# Patient Record
Sex: Female | Born: 1990 | Race: Black or African American | Hispanic: No | Marital: Single | Smoking: Never smoker
Health system: Southern US, Community
[De-identification: ages and names within clinical notes are randomized; demographics above are authoritative.]

## PROBLEM LIST (undated history)

## (undated) DIAGNOSIS — J020 Streptococcal pharyngitis: Secondary | ICD-10-CM

---
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
## 2012-04-06 IMAGING — CT CT ORBITS W/ CM
3 of 4 series · 18 of 30 positions shown, 20 images · IV contrast (agent unspecified)
Comparison: None.

CLINICAL DATA: Left orbital swelling, pain and blurred vision.

CT ORBITS WITH CONTRAST
TECHNIQUE: Multidetector CT imaging of the orbits was performed
following the bolus administration of intravenous contrast.
Contrast: 75 ml Amnipaque-HCC IV

[Series 3: recon 2: supine facial bones · axial · 0.33mm/px · z∈[+89,+109]mm · 2 of 34 slices shown]
[im 9/34  bone]
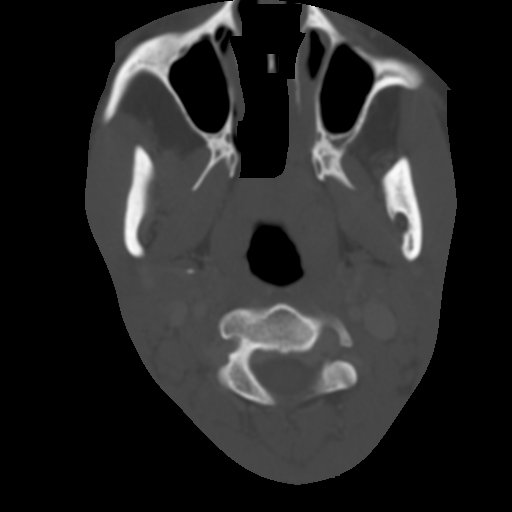
[im 17/34  bone]
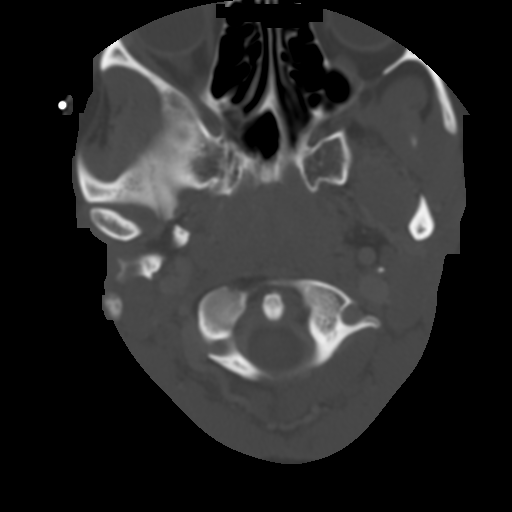

[Series 104: coronal bone · coronal · 0.33mm/px · 8 of 73 slices shown, 10 images]
[im 9/73  brain]
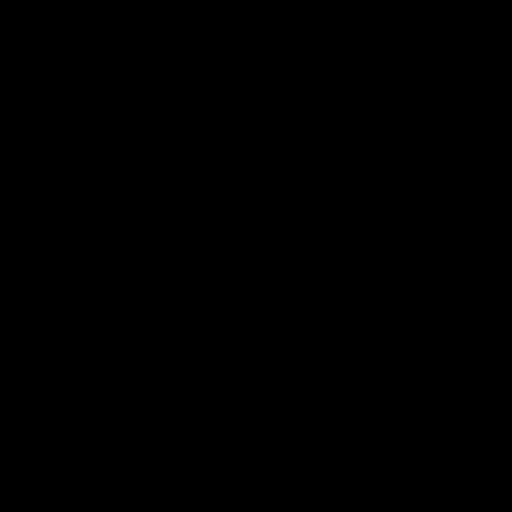
[im 9/73  bone]
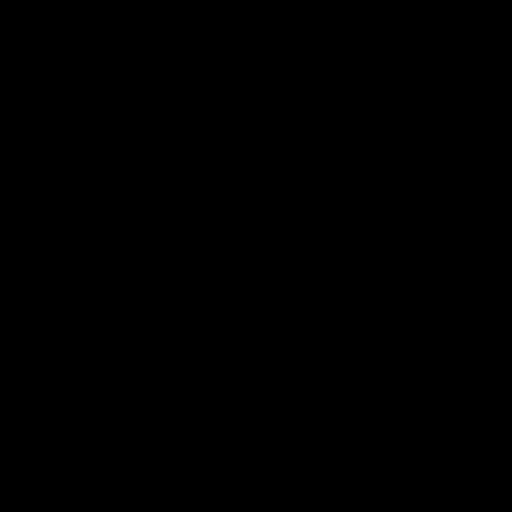
[im 17/73  bone]
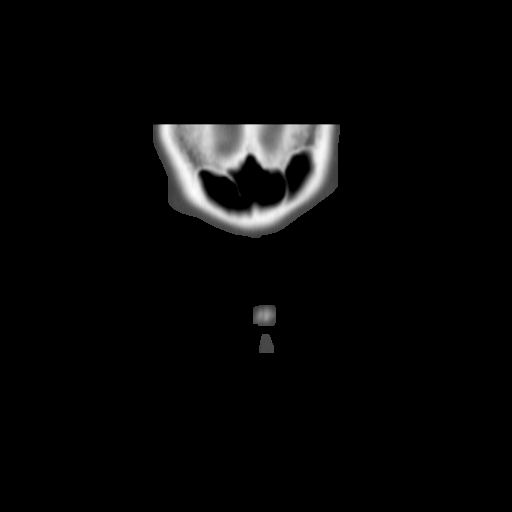
[im 25/73  bone]
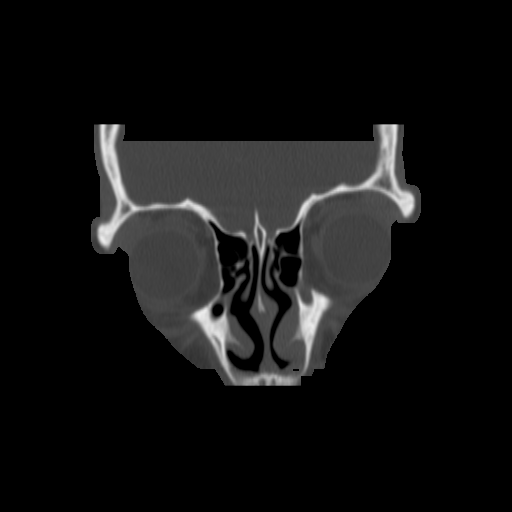
[im 33/73  bone]
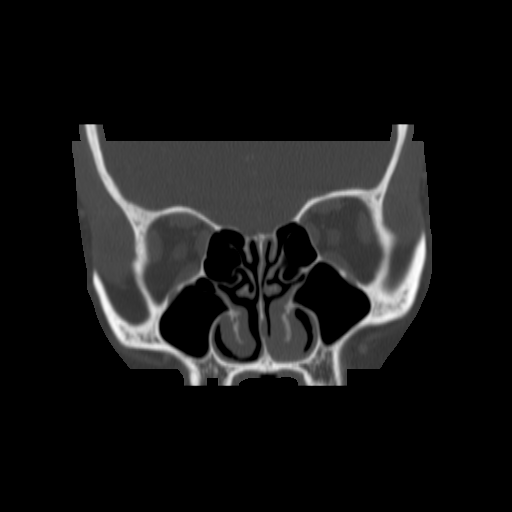
[im 41/73  brain]
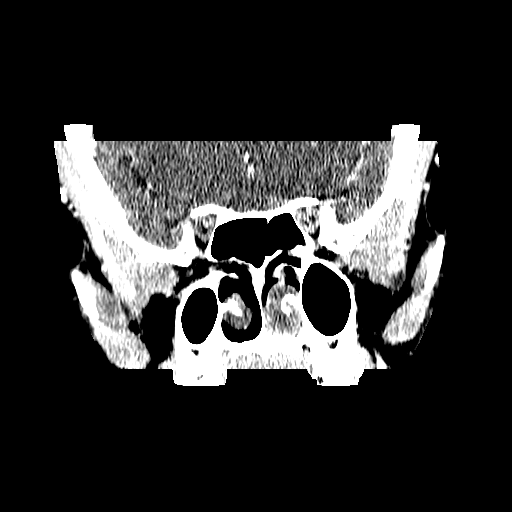
[im 41/73  bone]
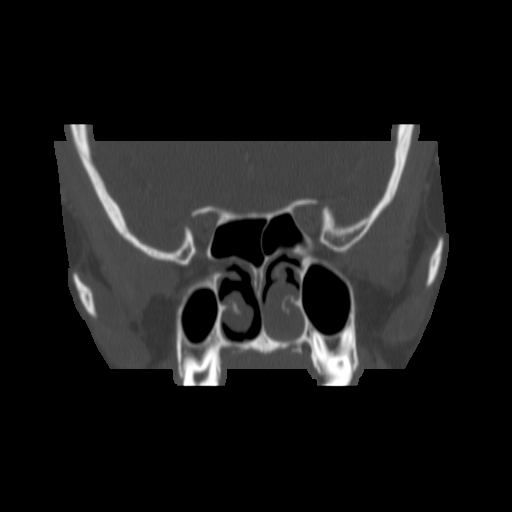
[im 49/73  bone]
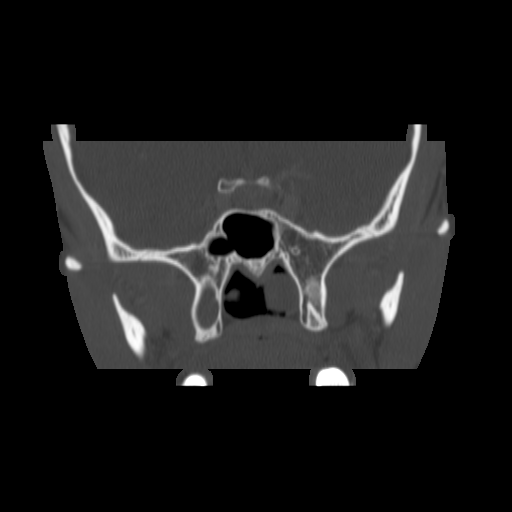
[im 57/73  bone]
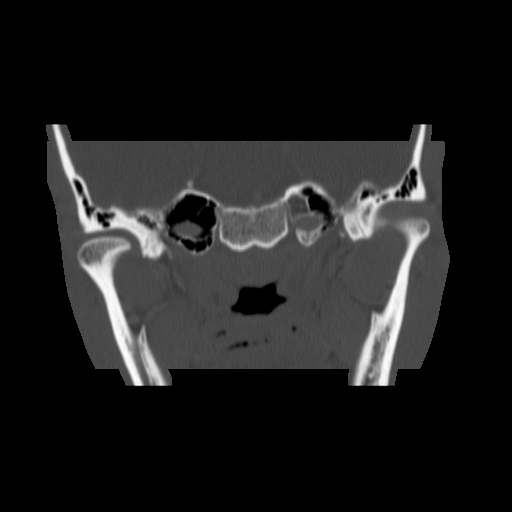
[im 65/73  bone]
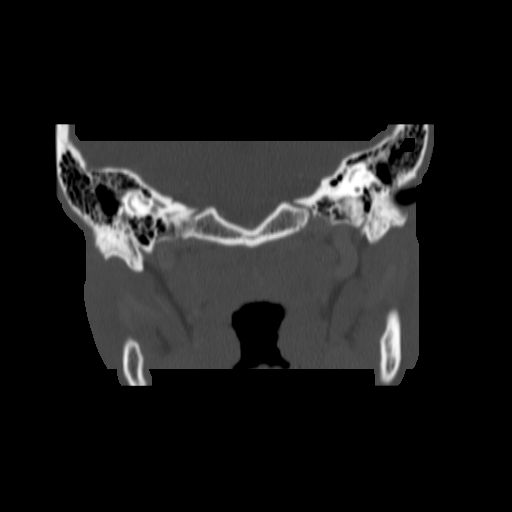

[Series 105: sag st · sagittal · 0.33mm/px · 8 of 76 slices shown]
[im 9/76  bone]
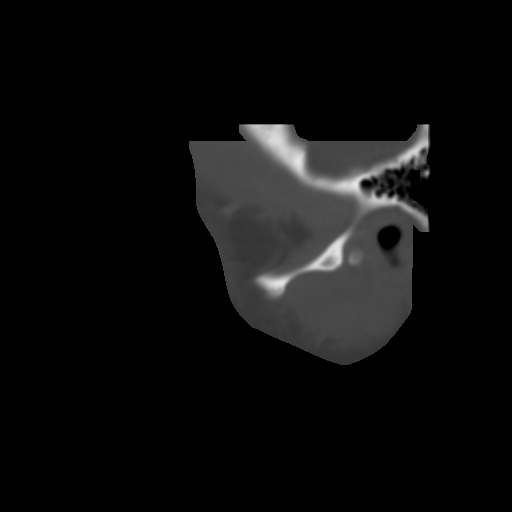
[im 17/76  bone]
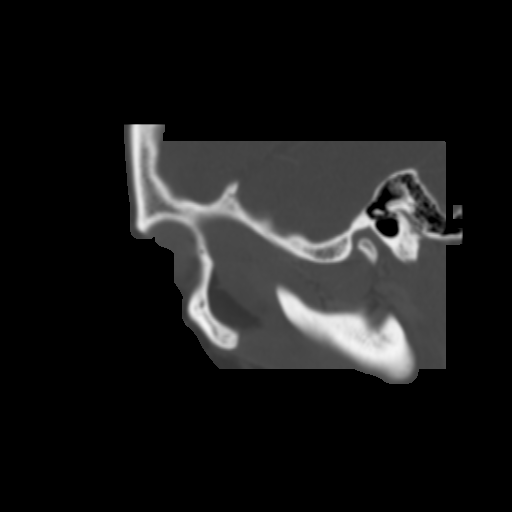
[im 26/76  bone]
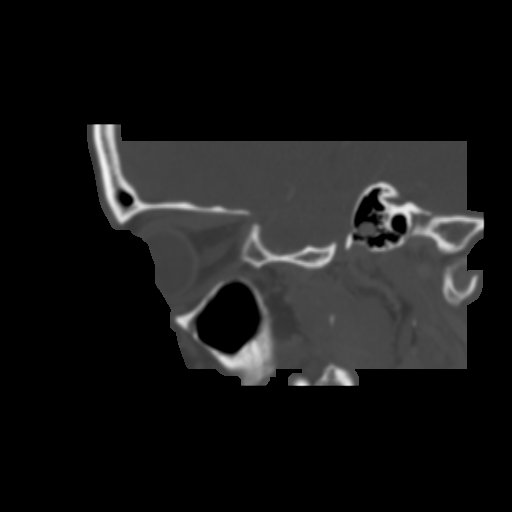
[im 34/76  bone]
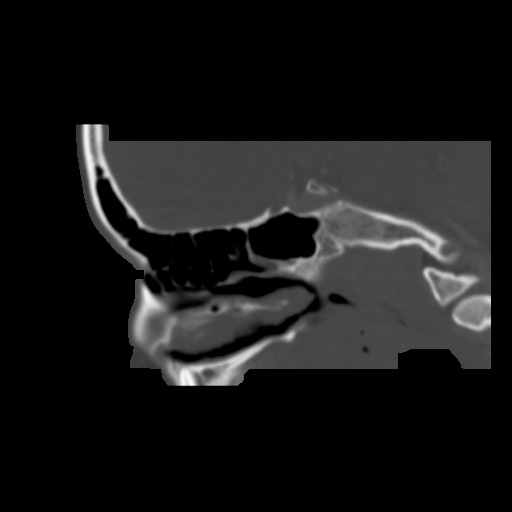
[im 42/76  bone]
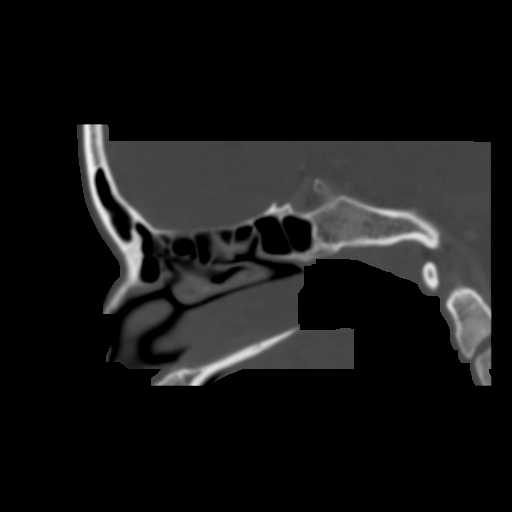
[im 51/76  bone]
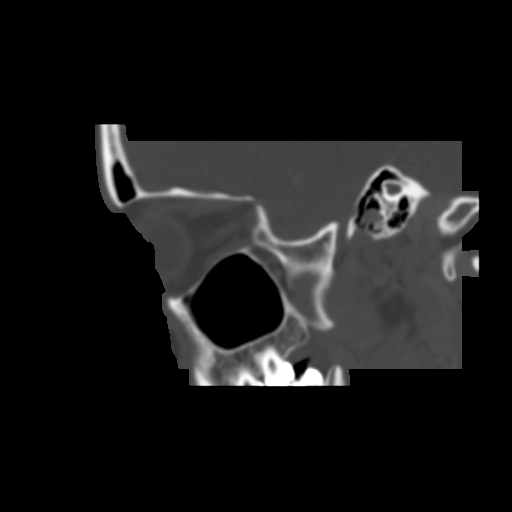
[im 59/76  bone]
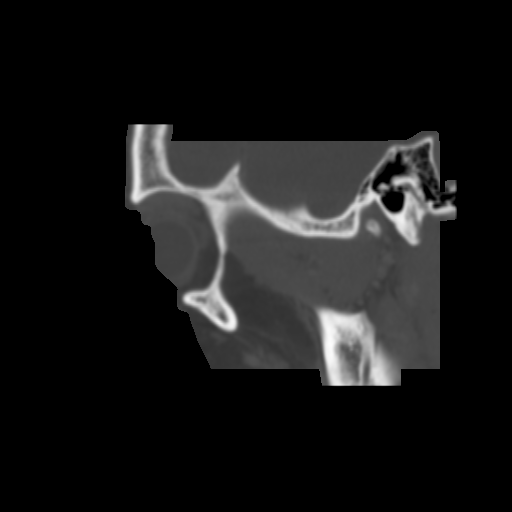
[im 67/76  bone]
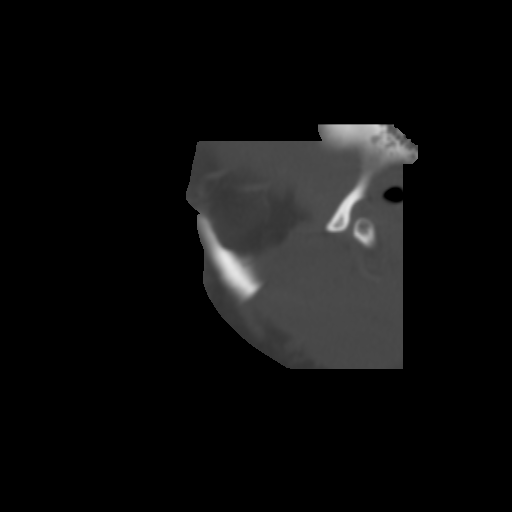

[18 of 30 positions shown; findings below may reference images not displayed]

FINDINGS: There is no evidence of significant orbital cellulitis by
CT.  No intraorbital fluid or inflammatory changes identified.  The
globes are intact and symmetric.  Extraocular musculature appears
normal.

Paranasal sinuses are normally aerated.  No evidence of focal
abscess.
IMPRESSION: No significant orbital cellulitis or evidence of abscess by CT.

## 2012-04-29 ENCOUNTER — Emergency Department (HOSPITAL_COMMUNITY)
Admission: EM | Admit: 2012-04-29 | Discharge: 2012-04-29 | Disposition: A | Discharge status: Home / Self Care | Payer: Medicaid Other | Attending: Emergency Medicine | Admitting: Emergency Medicine

## 2012-04-29 ENCOUNTER — Encounter (HOSPITAL_COMMUNITY): Payer: Self-pay | Admitting: *Deleted

## 2012-04-29 DIAGNOSIS — R51 Headache: Secondary | ICD-10-CM | POA: Insufficient documentation

## 2012-04-29 DIAGNOSIS — Z043 Encounter for examination and observation following other accident: Secondary | ICD-10-CM | POA: Insufficient documentation

## 2012-04-29 DIAGNOSIS — Y9389 Activity, other specified: Secondary | ICD-10-CM | POA: Insufficient documentation

## 2012-04-29 MED ORDER — IBUPROFEN 400 MG PO TABS
400.0000 mg | ORAL_TABLET | Freq: Once | ORAL | Status: AC
  Administered 2012-04-29: 400 mg via ORAL
  Filled 2012-04-29: qty 1
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
## 2012-05-01 NOTE — ED Provider Notes (Signed)
Medical screening examination/treatment/procedure(s) were performed by non-physician practitioner and as supervising physician I was immediately available for consultation/collaboration.   Elisabel Hanover Y. Sarp Vernier, MD 05/01/12 1150 

## 2012-11-25 ENCOUNTER — Emergency Department (HOSPITAL_COMMUNITY)
Admission: EM | Admit: 2012-11-25 | Discharge: 2012-11-25 | Disposition: A | Discharge status: Home / Self Care | Payer: Medicaid Other | Attending: Emergency Medicine | Admitting: Emergency Medicine

## 2012-11-25 ENCOUNTER — Encounter (HOSPITAL_COMMUNITY): Payer: Self-pay | Admitting: Emergency Medicine

## 2012-11-25 DIAGNOSIS — J029 Acute pharyngitis, unspecified: Secondary | ICD-10-CM | POA: Insufficient documentation

## 2012-11-25 DIAGNOSIS — J069 Acute upper respiratory infection, unspecified: Secondary | ICD-10-CM | POA: Insufficient documentation

## 2012-11-25 MED ORDER — FLUTICASONE PROPIONATE 50 MCG/ACT NA SUSP: 2.0000 | Freq: Every day | NASAL | Status: DC

## 2012-11-25 NOTE — ED Notes (Signed)
Pt reports that yesterday she started started having a cough and sore throat. Also reports a runny nose.

## 2012-11-25 NOTE — ED Provider Notes (Signed)
CSN: [id number]     Arrival date & time 11/25/12  1029 History   This chart was scribed for non-physician practitioner Irish Elders, FNP, working with Bonnita Levan. Bernette Mayers, MD, by Yevette Edwards, ED Scribe. This patient was seen in room TR07C/TR07C and the patient's care was started at 11:13 AM.  First MD Initiated Contact with Patient 11/25/12 1102     Chief Complaint  Patient presents with  . Cough  . Sore Throat    HPI HPI Comments: Donna Osborne is a 22 y.o. female who presents to the Emergency Department complaining of a cold symptoms which began yesterday. The pt reports that she first experienced a sore throat. The pt has also experienced a non-productive cough and rhinorrhea. She denies any post nasal drip, fever, chills, nausea, emesis, diarrhea, SOB, or wheezing. She also denies a h/o asthma.  Her boyfriend recently had a cold. The pt reports that she also has a suspected sty to her right eye.   History reviewed. No pertinent past medical history. History reviewed. No pertinent past surgical history. History reviewed. No pertinent family history. History  Substance Use Topics  . Smoking status: Never Smoker   . Smokeless tobacco: Not on file  . Alcohol Use: No   OB History   Grav Para Term Preterm Abortions TAB SAB Ect Mult Living   1              Review of Systems  Constitutional: Negative for fever and chills.  HENT: Positive for congestion, rhinorrhea and sore throat. Negative for postnasal drip.   Eyes:       Possible sty to right eye.    Respiratory: Positive for cough. Negative for shortness of breath and wheezing.   Gastrointestinal: Negative for nausea, vomiting and diarrhea.    Allergies  Review of patient's allergies indicates no known allergies.  Home Medications   Current Outpatient Rx  Name  Route  Sig  Dispense  Refill  . ferrous sulfate 325 (65 FE) MG tablet   Oral   Take 325 mg by mouth every other day.         Marland Kitchen OVER THE COUNTER  MEDICATION   Oral   Take 2 capsules by mouth once. Equate brand "cold and sinus"          Triage Vitals: BP 116/80  Pulse 91  Temp(Src) 98.7 F (37.1 C) (Oral)  Resp 18  SpO2 98%  Physical Exam  Nursing note and vitals reviewed. Constitutional: She is oriented to person, place, and time. She appears well-developed and well-nourished. No distress.  HENT:  Head: Normocephalic and atraumatic.  Mouth/Throat: No oropharyngeal exudate.  Positive light reflex bilaterally. Mild clear fluid present to TMs. TMs are not retracted.  Mildly erythematous to oropharynx.  Tonsils not swollen. No exudate present.   Eyes: EOM are normal.  Neck: Neck supple. No tracheal deviation present.  Cardiovascular: Normal rate.   Pulmonary/Chest: Effort normal. No respiratory distress.  Musculoskeletal: Normal range of motion.  Neurological: She is alert and oriented to person, place, and time.  Skin: Skin is warm and dry.  Psychiatric: She has a normal mood and affect. Her behavior is normal.    ED Course  Procedures (including critical care time)  DIAGNOSTIC STUDIES: Oxygen Saturation is 98% on room air, normal by my interpretation.    COORDINATION OF CARE:  11:17 AM-Discussed treatment plan with patient which includes tylenol and motrin as well as increased fluids, and the patient agreed to the  plan. Informed pt she would be provided a prescription for a steroid nasal spray.   Labs Review Labs Reviewed - No data to display Imaging Review No results found.  EKG Interpretation   None       MDM   1. URI (upper respiratory infection)     Viral URI, no fever or chills. No wheezing or productive cough. Mild sore throat from drainage. Flonase for nasal congestion, increase fluids.    I personally performed the services described in this documentation, which was scribed in my presence. The recorded information has been reviewed and is accurate.     Irish Elders, NP 11/25/12 1138

## 2012-11-25 NOTE — ED Provider Notes (Signed)
Medical screening examination/treatment/procedure(s) were performed by non-physician practitioner and as supervising physician I was immediately available for consultation/collaboration.   Charles B. Sheldon, MD 11/25/12 1546 

## 2013-12-07 ENCOUNTER — Encounter (HOSPITAL_COMMUNITY): Payer: Self-pay | Admitting: Emergency Medicine

## 2014-07-26 ENCOUNTER — Emergency Department (HOSPITAL_COMMUNITY)
Admission: EM | Admit: 2014-07-26 | Discharge: 2014-07-26 | Disposition: A | Discharge status: Home / Self Care | Payer: BLUE CROSS/BLUE SHIELD | Attending: Emergency Medicine | Admitting: Emergency Medicine

## 2014-07-26 ENCOUNTER — Encounter (HOSPITAL_COMMUNITY): Payer: Self-pay | Admitting: *Deleted

## 2014-07-26 DIAGNOSIS — J069 Acute upper respiratory infection, unspecified: Secondary | ICD-10-CM | POA: Diagnosis not present

## 2014-07-26 DIAGNOSIS — R490 Dysphonia: Secondary | ICD-10-CM | POA: Diagnosis present

## 2014-07-26 DIAGNOSIS — R590 Localized enlarged lymph nodes: Secondary | ICD-10-CM | POA: Insufficient documentation

## 2014-07-26 DIAGNOSIS — Z7951 Long term (current) use of inhaled steroids: Secondary | ICD-10-CM | POA: Diagnosis not present

## 2014-07-26 LAB — RAPID STREP SCREEN (MED CTR MEBANE ONLY): STREPTOCOCCUS, GROUP A SCREEN (DIRECT): NEGATIVE

## 2014-07-26 MED ORDER — LORATADINE 10 MG PO TABS: 10.0000 mg | ORAL_TABLET | Freq: Every day | ORAL | Status: DC

## 2014-07-26 MED ORDER — GUAIFENESIN 100 MG/5ML PO SYRP: 100.0000 mg | ORAL_SOLUTION | ORAL | Status: DC | PRN

## 2014-07-26 NOTE — Discharge Instructions (Signed)
Your strep screen is negative. We are treating your symptoms with medication for congestion. Follow up with your doctor or return as needed for worsening symptoms.

## 2014-07-26 NOTE — ED Notes (Signed)
Declined W/C at D/C and was escorted to lobby by RN. 

## 2014-07-26 NOTE — ED Notes (Signed)
Pt states that she has been hoarse and had nasal drainage since yesterday.

## 2014-07-26 NOTE — ED Provider Notes (Signed)
CSN: [id number]     Arrival date & time 07/26/14  1358 History   This chart was scribed for non-physician practitioner, [facility] M. Damian Leavell, NP working with Doug Sou, MD by Doreatha Martin, ED scribe. This patient was seen in room TR07C/TR07C and the patient's care was started at 2:31 PM    Chief Complaint  Patient presents with  . Hoarse   Patient is a 24 y.o. female presenting with pharyngitis. The history is provided by the patient. No language interpreter was used.  Sore Throat This is a new problem. The current episode started more than 2 days ago. The problem occurs constantly. The problem has not changed since onset.Pertinent negatives include no abdominal pain and no shortness of breath. Nothing aggravates the symptoms. Nothing relieves the symptoms. She has tried nothing for the symptoms. The treatment provided no relief.   HPI Comments: Donna Osborne is a 24 y.o. female who presents to the Emergency Department complaining of moderate, constant hoarseness onset yesterday. She states associated sneezing and drainage onset 4 days ago, sore throat onset 3 days ago, chills, swollen cervical glands, no difficulty swallowing, dry cough. She states sick contact with daughter who has similar symptoms. She denies pregnancy and breast feeding. She also denies fever, nausea, vomiting, constipation, diarrhea, SOB, eye drainage or  redness, changes in vision, otalgia, abdominal pain and back pain.   History reviewed. No pertinent past medical history. History reviewed. No pertinent past surgical history. No family history on file. History  Substance Use Topics  . Smoking status: Never Smoker   . Smokeless tobacco: Not on file  . Alcohol Use: No   OB History    Gravida Para Term Preterm AB TAB SAB Ectopic Multiple Living   1              Review of Systems  HENT: Positive for postnasal drip, sneezing and voice change. Negative for trouble swallowing.   Eyes: Negative for redness and visual  disturbance.  Respiratory: Positive for cough (dry cough). Negative for shortness of breath.   Gastrointestinal: Negative for nausea, vomiting, abdominal pain, diarrhea and constipation.  Musculoskeletal: Negative for back pain.  All other systems reviewed and are negative.  Allergies  Review of patient's allergies indicates no known allergies.  Home Medications   Prior to Admission medications   Medication Sig Start Date End Date Taking? Authorizing Provider  ferrous sulfate 325 (65 FE) MG tablet Take 325 mg by mouth every other day.    Historical Provider, MD  fluticasone (FLONASE) 50 MCG/ACT nasal spray Place 2 sprays into the nose daily. 11/25/12   Irish Elders, NP  guaifenesin (ROBITUSSIN) 100 MG/5ML syrup Take 5-10 mLs (100-200 mg total) by mouth every 4 (four) hours as needed for cough. 07/26/14   Hope Orlene Och, NP  loratadine (CLARITIN) 10 MG tablet Take 1 tablet (10 mg total) by mouth daily. 07/26/14   Hope Orlene Och, NP  OVER THE COUNTER MEDICATION Take 2 capsules by mouth once. Equate brand "cold and sinus"    Historical Provider, MD   BP 111/63 mmHg  Pulse 61  Temp(Src) 98.4 F (36.9 C) (Oral)  Resp 16  SpO2 96% Physical Exam  Constitutional: She is oriented to person, place, and time. She appears well-developed and well-nourished. No distress.  HENT:  Head: Normocephalic and atraumatic.  Right Ear: Tympanic membrane normal.  Left Ear: Tympanic membrane normal.  Nose: Mucosal edema and rhinorrhea present.  Mouth/Throat: Uvula is midline and mucous membranes are normal.  Posterior oropharyngeal erythema present. No posterior oropharyngeal edema.  Eyes: Conjunctivae and EOM are normal. Pupils are equal, round, and reactive to light.  Neck: Normal range of motion. Neck supple.  No meningeal signs. Cervical node enlarged on the right.   Cardiovascular: Normal rate, regular rhythm and normal heart sounds.   Pulmonary/Chest: Effort normal and breath sounds normal. No respiratory  distress.  Lungs CTA.  Abdominal: Soft. Bowel sounds are normal. She exhibits no distension.  Genitourinary:  No CVA tenderness.   Musculoskeletal: Normal range of motion.  Lymphadenopathy:    She has cervical adenopathy (right).  Neurological: She is alert and oriented to person, place, and time.  Skin: Skin is warm and dry.  Psychiatric: She has a normal mood and affect. Her behavior is normal.  Nursing note and vitals reviewed.   ED Course  Procedures (including critical care time) DIAGNOSTIC STUDIES: Oxygen Saturation is 96% on RA, adequate by my interpretation.    COORDINATION OF CARE: 2:37 PM Discussed treatment plan with pt at bedside and pt agreed to plan.   Labs Review Results for orders placed or performed during the hospital encounter of 07/26/14 (from the past 24 hour(s))  Rapid strep screen     Status: None   Collection Time: 07/26/14  2:40 PM  Result Value Ref Range   Streptococcus, Group A Screen (Direct) NEGATIVE NEGATIVE     MDM  24 y.o. female with sore throat, dry cough, post nasal drainage x 4 days. Stable for d/c without difficulty swallowing and patient does not appear toxic. Will treat for URI and she will follow up with her PCP or return her as needed.   Final diagnoses:  Acute URI    I personally performed the services described in this documentation, which was scribed in my presence. The recorded information has been reviewed and is accurate.    [address] [place], [place] 07/26/14 2143  Doug Sou, MD 07/27/14 [phone]

## 2014-07-28 LAB — CULTURE, GROUP A STREP: Strep A Culture: NEGATIVE

## 2015-01-28 ENCOUNTER — Encounter (HOSPITAL_COMMUNITY): Payer: Self-pay

## 2015-01-28 ENCOUNTER — Emergency Department (HOSPITAL_COMMUNITY)
Admission: EM | Admit: 2015-01-28 | Discharge: 2015-01-28 | Disposition: A | Discharge status: Home / Self Care | Payer: BLUE CROSS/BLUE SHIELD | Attending: Emergency Medicine | Admitting: Emergency Medicine

## 2015-01-28 DIAGNOSIS — J029 Acute pharyngitis, unspecified: Secondary | ICD-10-CM | POA: Insufficient documentation

## 2015-01-28 DIAGNOSIS — Z7951 Long term (current) use of inhaled steroids: Secondary | ICD-10-CM | POA: Insufficient documentation

## 2015-01-28 DIAGNOSIS — R509 Fever, unspecified: Secondary | ICD-10-CM | POA: Insufficient documentation

## 2015-01-28 DIAGNOSIS — Z79899 Other long term (current) drug therapy: Secondary | ICD-10-CM | POA: Diagnosis not present

## 2015-01-28 LAB — RAPID STREP SCREEN (MED CTR MEBANE ONLY): STREPTOCOCCUS, GROUP A SCREEN (DIRECT): NEGATIVE

## 2015-01-28 MED ORDER — ACETAMINOPHEN 325 MG PO TABS
650.0000 mg | ORAL_TABLET | Freq: Once | ORAL | Status: AC | PRN
  Administered 2015-01-28: 650 mg via ORAL
  Filled 2015-01-28: qty 2

## 2015-01-28 MED ORDER — PENICILLIN G BENZATHINE 1200000 UNIT/2ML IM SUSP
1.2000 10*6.[IU] | Freq: Once | INTRAMUSCULAR | Status: AC
  Administered 2015-01-28: 1.2 10*6.[IU] via INTRAMUSCULAR
  Filled 2015-01-28: qty 2

## 2015-01-28 NOTE — Discharge Instructions (Signed)
You have been treated for strep throat. May use over the counter chloraseptic to help with throat pain. May take tylenol/motrin as needed for pain or fever. Follow-up with your primary care physician. Return here for new concerns.

## 2015-01-28 NOTE — ED Notes (Signed)
Pt verbalized understanding of d/c instructions and follow-up care. No further questions/concerns, VSS, ambulatory w/ steady gait (refused wheelchair) 

## 2015-01-28 NOTE — ED Notes (Signed)
Pt c/o sore throat, onset Tuesday. Denies any respiratory congestion or difficulty breathing.

## 2015-01-28 NOTE — ED Provider Notes (Signed)
CSN: [id number]     Arrival date & time 01/28/15  7253 History   First MD Initiated Contact with Patient 01/28/15 [phone]     Chief Complaint  Patient presents with  . Sore Throat     (Consider location/radiation/quality/duration/timing/severity/associated sxs/prior Treatment) Patient is a 24 y.o. female presenting with pharyngitis. The history is provided by the patient and medical records.  Sore Throat Associated symptoms include a sore throat.   24 year old female here with sore throat. Patient states over the past 3 days she has had a progressively worsening sore throat. She denies any fever or chills at home, low-grade fever here in ED. States it is becoming painful to swallow, however she is still able to eat and drink normally. She denies any sick contacts. She has history of strep throat a few years ago with similar symptoms. She is not tried any medications prior to arrival.  No past medical history on file. Past Surgical History  Procedure Laterality Date  . Wisdom tooth extraction  2011   No family history on file. Social History  Substance Use Topics  . Smoking status: Never Smoker   . Smokeless tobacco: Not on file  . Alcohol Use: No   OB History    Gravida Para Term Preterm AB TAB SAB Ectopic Multiple Living   1              Review of Systems  HENT: Positive for sore throat.   All other systems reviewed and are negative.     Allergies  Review of patient's allergies indicates no known allergies.  Home Medications   Prior to Admission medications   Medication Sig Start Date End Date Taking? Authorizing Provider  ferrous sulfate 325 (65 FE) MG tablet Take 325 mg by mouth every other day.    Historical Provider, MD  fluticasone (FLONASE) 50 MCG/ACT nasal spray Place 2 sprays into the nose daily. 11/25/12   Irish Elders, NP  guaifenesin (ROBITUSSIN) 100 MG/5ML syrup Take 5-10 mLs (100-200 mg total) by mouth every 4 (four) hours as needed for cough. 07/26/14    Hope Orlene Och, NP  loratadine (CLARITIN) 10 MG tablet Take 1 tablet (10 mg total) by mouth daily. 07/26/14   Hope Orlene Och, NP  OVER THE COUNTER MEDICATION Take 2 capsules by mouth once. Equate brand "cold and sinus"    Historical Provider, MD   BP 112/71 mmHg  Pulse 108  Temp(Src) 99.9 F (37.7 C) (Oral)  Resp 18  Ht  (1.626 m)  Wt 73.483 kg  BMI 27.79 kg/m2  SpO2 98%  LMP 01/24/2015   Physical Exam  Constitutional: She is oriented to person, place, and time. She appears well-developed and well-nourished. No distress.  HENT:  Head: Normocephalic and atraumatic.  Mouth/Throat: Uvula is midline and mucous membranes are normal. No oral lesions. No trismus in the jaw. Posterior oropharyngeal erythema present.  Tonsils erythematous and enlarged bilaterally with small exudates present; uvula midline without peritonsillar abscess; handling secretions appropriately; no difficulty swallowing or speaking  Eyes: Conjunctivae and EOM are normal. Pupils are equal, round, and reactive to light.  Neck: Normal range of motion. Neck supple.  Cardiovascular: Normal rate, regular rhythm and normal heart sounds.   Pulmonary/Chest: Effort normal and breath sounds normal. No respiratory distress. She has no wheezes.  Abdominal: Soft. Bowel sounds are normal. There is no tenderness. There is no guarding.  Musculoskeletal: Normal range of motion.  Lymphadenopathy:    She has cervical adenopathy.  Neurological:  She is alert and oriented to person, place, and time.  Skin: Skin is warm. She is not diaphoretic.  Psychiatric: She has a normal mood and affect.  Nursing note and vitals reviewed.   ED Course  Procedures (including critical care time) Labs Review Labs Reviewed  RAPID STREP SCREEN (NOT AT [facility])  CULTURE, GROUP A STREP    Imaging Review No results found. I have personally reviewed and evaluated these images and lab results as part of my medical decision-making.   EKG  Interpretation None      MDM   Final diagnoses:  Sore throat   24 year old female here with sore throat. Exam findings are clinically consistent with strep pharyngitis. She has no evidence of peritonsillar abscess at this time. She is handling her secretions well and has no difficulty swallowing or speaking. Her rapid strep is negative, however i feel that her exam findings warrant treatment.  Strep culture pending.  Patient treated with bicillin here, d/c home with supportive care.  Discussed plan with patient, he/she acknowledged understanding and agreed with plan of care.  Return precautions given for new or worsening symptoms.  Garlon HatchetLisa M Caelan Atchley, PA-C 01/28/15 1318  Rolan BuccoMelanie Belfi, MD 01/28/15 1430

## 2015-01-30 LAB — CULTURE, GROUP A STREP: Strep A Culture: NEGATIVE

## 2015-05-09 ENCOUNTER — Encounter (HOSPITAL_COMMUNITY): Payer: Self-pay | Admitting: Nurse Practitioner

## 2015-05-09 ENCOUNTER — Emergency Department (HOSPITAL_COMMUNITY)
Admission: EM | Admit: 2015-05-09 | Discharge: 2015-05-09 | Disposition: A | Discharge status: Home / Self Care | Payer: BLUE CROSS/BLUE SHIELD | Attending: Emergency Medicine | Admitting: Emergency Medicine

## 2015-05-09 DIAGNOSIS — Y92410 Unspecified street and highway as the place of occurrence of the external cause: Secondary | ICD-10-CM | POA: Insufficient documentation

## 2015-05-09 DIAGNOSIS — S0990XA Unspecified injury of head, initial encounter: Secondary | ICD-10-CM | POA: Diagnosis not present

## 2015-05-09 DIAGNOSIS — Y998 Other external cause status: Secondary | ICD-10-CM | POA: Diagnosis not present

## 2015-05-09 DIAGNOSIS — S199XXA Unspecified injury of neck, initial encounter: Secondary | ICD-10-CM | POA: Diagnosis not present

## 2015-05-09 DIAGNOSIS — Y9389 Activity, other specified: Secondary | ICD-10-CM | POA: Insufficient documentation

## 2015-05-09 DIAGNOSIS — Z79899 Other long term (current) drug therapy: Secondary | ICD-10-CM | POA: Insufficient documentation

## 2015-05-09 DIAGNOSIS — S6992XA Unspecified injury of left wrist, hand and finger(s), initial encounter: Secondary | ICD-10-CM | POA: Insufficient documentation

## 2015-05-09 MED ORDER — ACETAMINOPHEN 325 MG PO TABS
650.0000 mg | ORAL_TABLET | Freq: Once | ORAL | Status: AC
  Administered 2015-05-09: 650 mg via ORAL
  Filled 2015-05-09: qty 2

## 2015-05-09 MED ORDER — METHOCARBAMOL 500 MG PO TABS: 500.0000 mg | ORAL_TABLET | Freq: Two times a day (BID) | ORAL | Status: DC

## 2015-05-09 MED ORDER — NAPROXEN 500 MG PO TABS: 500.0000 mg | ORAL_TABLET | Freq: Two times a day (BID) | ORAL | Status: DC
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
## 2015-05-09 NOTE — Discharge Instructions (Signed)
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
## 2015-05-09 NOTE — ED Notes (Signed)
Declined W/C at D/C and was escorted to lobby by RN. 

## 2016-06-30 ENCOUNTER — Encounter (HOSPITAL_COMMUNITY): Payer: Self-pay | Admitting: *Deleted

## 2016-06-30 ENCOUNTER — Emergency Department (HOSPITAL_COMMUNITY)
Admission: EM | Admit: 2016-06-30 | Discharge: 2016-06-30 | Disposition: A | Discharge status: Home / Self Care | Payer: BLUE CROSS/BLUE SHIELD | Attending: Physician Assistant | Admitting: Physician Assistant

## 2016-06-30 DIAGNOSIS — J029 Acute pharyngitis, unspecified: Secondary | ICD-10-CM

## 2016-06-30 DIAGNOSIS — Z79899 Other long term (current) drug therapy: Secondary | ICD-10-CM | POA: Insufficient documentation

## 2016-06-30 HISTORY — DX: Streptococcal pharyngitis: J02.0

## 2016-06-30 LAB — RAPID STREP SCREEN (MED CTR MEBANE ONLY): Streptococcus, Group A Screen (Direct): NEGATIVE

## 2016-06-30 MED ORDER — PROMETHAZINE-DM 6.25-15 MG/5ML PO SYRP
5.0000 mL | ORAL_SOLUTION | Freq: Four times a day (QID) | ORAL | 0 refills | Status: DC | PRN
Start: 2016-06-30 — End: 2017-04-11

## 2016-06-30 NOTE — Discharge Instructions (Signed)
Viral pharyngitis is treated by managing the symptoms. You can take ibuprofen, 400 mg every 4 hours to help with relief from sore throat. You can also take the cough syrup every 4-6 hours (5 mL) as needed for cough suppression. Please note: this medication can make you sleepy so please use caution with driving or working. If you develop a fever, chills, or difficulty breathing, please return to the Emergency Department for re-evaluation.

## 2016-06-30 NOTE — ED Triage Notes (Signed)
To ED for eval of sore throat for past couple of days. Unknown fevers. No resp distress noted.

## 2016-06-30 NOTE — ED Provider Notes (Signed)
[facility] Provider Note   CSN: [id number] Arrival date & time: 06/30/16  1130  By signing my name below, I, Diona Browner, attest that this documentation has been prepared under the direction and in the presence of Maddilynn Esperanza A. Adisson Deak, PA-C. Electronically Signed: Diona Browner, ED Scribe. 06/30/16. 12:16 PM.  History   Chief Complaint Chief Complaint  Patient presents with  . Sore Throat    HPI Donna Osborne is a 26 y.o. female with a PMHx of strep throat who presents to the Emergency Department complaining of a gradually worsening sore throat that started 4 days ago. Pt reports waking up with the discomfort. She notes trouble swallowing and feels like her glands are swollen. She hasn't taken any medication at home for the pain. Associated sx include HA, congestion, and cough. No allergies to medication. No medications taken daily. No one else is sick at home. Pt denies fever, SOB, drooling, rhinorrhea, nasal drainage and ear pain.  The history is provided by the patient. No language interpreter was used.    Past Medical History:  Diagnosis Date  . Strep throat     There are no active problems to display for this patient.   Past Surgical History:  Procedure Laterality Date  . WISDOM TOOTH EXTRACTION  2011    OB History    Gravida Para Term Preterm AB Living   1             SAB TAB Ectopic Multiple Live Births                   Home Medications    Prior to Admission medications   Medication Sig Start Date End Date Taking? Authorizing Provider  ferrous sulfate 325 (65 FE) MG tablet Take 325 mg by mouth every other day.    [provider]  methocarbamol (ROBAXIN) 500 MG tablet Take 1 tablet (500 mg total) by mouth 2 (two) times daily. 05/09/15   Felicie Morn, NP  naproxen (NAPROSYN) 500 MG tablet Take 1 tablet (500 mg total) by mouth 2 (two) times daily. 05/09/15   Felicie Morn, NP  promethazine-dextromethorphan (PROMETHAZINE-DM) 6.25-15 MG/5ML  syrup Take 5 mLs by mouth 4 (four) times daily as needed for cough. 06/30/16   Samiyah Stupka A, PA-C    Family History No family history on file.  Social History Social History  Substance Use Topics  . Smoking status: Never Smoker  . Smokeless tobacco: Never Used  . Alcohol use No     Allergies   Patient has no known allergies.   Review of Systems Review of Systems  Constitutional: Negative for activity change and fever.  HENT: Positive for congestion, sore throat and trouble swallowing. Negative for drooling, ear pain, postnasal drip and rhinorrhea.   Respiratory: Positive for cough. Negative for shortness of breath.   Cardiovascular: Negative for chest pain.  Gastrointestinal: Negative for abdominal pain.  Musculoskeletal: Negative for back pain.  Skin: Negative for rash.  Neurological: Positive for headaches.    Physical Exam Updated Vital Signs BP 99/66   Pulse 86   Temp 98.8 F (37.1 C) (Oral)   Resp 16   SpO2 100%   Physical Exam  Constitutional: No distress.  HENT:  Head: Normocephalic.  Right Ear: Tympanic membrane normal.  Left Ear: Tympanic membrane normal.  Nose: Nose normal.  Mouth/Throat: Uvula is midline. Posterior oropharyngeal erythema present. No oropharyngeal exudate, posterior oropharyngeal edema or tonsillar abscesses.  Eyes: Conjunctivae are normal.  Neck: Neck supple.  Cardiovascular: Normal rate, regular rhythm and normal heart sounds.  Exam reveals no gallop and no friction rub.   No murmur heard. Pulmonary/Chest: Effort normal and breath sounds normal. No respiratory distress. She has no wheezes. She has no rales.  Abdominal: Soft. She exhibits no distension.  Lymphadenopathy:    She has cervical adenopathy.  Neurological: She is alert.  Skin: Skin is warm. No rash noted.  Psychiatric: Her behavior is normal.  Nursing note and vitals reviewed.   ED Treatments / Results  DIAGNOSTIC STUDIES: Oxygen Saturation is 100% on RA, normal  by my interpretation.   COORDINATION OF CARE: 12:16 PM-Discussed next steps with pt which includes. Pt verbalized understanding and is agreeable with the plan.    Labs (all labs ordered are listed, but only abnormal results are displayed) Labs Reviewed  RAPID STREP SCREEN (NOT AT [facility])  CULTURE, GROUP A STREP [facility])    EKG  EKG Interpretation None       Radiology No results found.  Procedures Procedures (including critical care time)  Medications Ordered in ED Medications - No data to display   Initial Impression / Assessment and Plan / ED Course  I have reviewed the triage vital signs and the nursing notes.  Pertinent labs & imaging results that were available during my care of the patient were reviewed by me and considered in my medical decision making (see chart for details).     26 y.o. female patient with 4 days of acute pharyngitis.   Afebrile with anterior cervical lymphadenopathy; no cough. No tonsillar exudate or swelling. Pain is not out of proportion to exam. No asymmetry of the uvula, tonsils, or soft palate. No stridor, trismus, or "hot potato" voice. The patient appears non-toxic. NAD. VSS. Rapid strep negative.  Presentation non concerning for PTA or Ludwig's angina, Uvulitis, epiglottitis, peritonsillar abscess, or retropharyngeal abscess. Specific return precautions discussed. Pt able to drink water in ED without difficulty with intact air way. Will d/c the patient with symptomatic treatment. Recommended PCP follow up.      Final Clinical Impressions(s) / ED Diagnoses   Final diagnoses:  Viral pharyngitis    New Prescriptions Discharge Medication List as of 06/30/2016 12:51 PM    START taking these medications   Details  promethazine-dextromethorphan (PROMETHAZINE-DM) 6.25-15 MG/5ML syrup Take 5 mLs by mouth 4 (four) times daily as needed for cough., Starting Sat 06/30/2016, Print       I personally performed the services described in this  documentation, which was scribed in my presence. The recorded information has been reviewed and is accurate.     Barkley BoardsMcDonald, Keylen Uzelac A, PA-C 07/03/16 1229    Abelino DerrickMackuen, Courteney Lyn, MD 07/05/16 [phone]

## 2016-06-30 NOTE — ED Notes (Signed)
Declined W/C at D/C and was escorted to lobby by RN. 

## 2016-07-02 LAB — CULTURE, GROUP A STREP (THRC)

## 2016-07-12 ENCOUNTER — Encounter (HOSPITAL_COMMUNITY): Payer: Self-pay | Admitting: Emergency Medicine

## 2016-07-12 ENCOUNTER — Ambulatory Visit (HOSPITAL_COMMUNITY)
Admission: EM | Admit: 2016-07-12 | Discharge: 2016-07-12 | Disposition: A | Discharge status: Home / Self Care | Payer: Self-pay | Attending: Internal Medicine | Admitting: Internal Medicine

## 2016-07-12 DIAGNOSIS — T671XXA Heat syncope, initial encounter: Secondary | ICD-10-CM

## 2016-07-12 DIAGNOSIS — S00212A Abrasion of left eyelid and periocular area, initial encounter: Secondary | ICD-10-CM

## 2016-07-12 DIAGNOSIS — E860 Dehydration: Secondary | ICD-10-CM

## 2016-07-12 NOTE — ED Provider Notes (Signed)
CSN: [id number]     Arrival date & time 07/12/16  1902 History   First MD Initiated Contact with Patient 07/12/16 2043     Chief Complaint  Patient presents with  . Laceration  . Loss of Consciousness   (Consider location/radiation/quality/duration/timing/severity/associated sxs/prior Treatment) 26 year old female presents to the urgent care stating around 10 AM this morning she got up suddenly gone to the bathroom at a nail salon and she got weak and had a brief loss of consciousness. She fell and struck her right eyelid on an object causing a superficial abrasion. She states this is happened in the past due to dehydration. Patient admits that she does not drink enough water that she has been out in the sun working and not rehydrating. She states she feels well now, no headache no problems with vision, speech, hearing, swallowing, focal paresthesias or weakness. Denies problems with memory, cognition. She wants her wound of the left eyelid check to see if it needs repaired.      Past Medical History:  Diagnosis Date  . Strep throat    Past Surgical History:  Procedure Laterality Date  . WISDOM TOOTH EXTRACTION  2011   History reviewed. No pertinent family history. Social History  Substance Use Topics  . Smoking status: Never Smoker  . Smokeless tobacco: Never Used  . Alcohol use No   OB History    Gravida Para Term Preterm AB Living   1             SAB TAB Ectopic Multiple Live Births                 Review of Systems  Constitutional: Negative.   HENT: Negative.   Eyes: Negative for pain, discharge, redness and visual disturbance.  Respiratory: Negative.   Gastrointestinal: Negative.   Musculoskeletal: Negative.   Skin: Positive for wound.  Neurological: Negative.   Psychiatric/Behavioral: Negative.   All other systems reviewed and are negative.   Allergies  Patient has no known allergies.  Home Medications   Prior to Admission medications   Medication Sig  Start Date End Date Taking? Authorizing Provider  ferrous sulfate 325 (65 FE) MG tablet Take 325 mg by mouth every other day.    [provider]  methocarbamol (ROBAXIN) 500 MG tablet Take 1 tablet (500 mg total) by mouth 2 (two) times daily. 05/09/15   Felicie Morn, NP  naproxen (NAPROSYN) 500 MG tablet Take 1 tablet (500 mg total) by mouth 2 (two) times daily. 05/09/15   Felicie Morn, NP  promethazine-dextromethorphan (PROMETHAZINE-DM) 6.25-15 MG/5ML syrup Take 5 mLs by mouth 4 (four) times daily as needed for cough. 06/30/16   McDonald, Mia A, PA-C   Meds Ordered and Administered this Visit  Medications - No data to display  BP (!) 104/55 (BP Location: Right Arm)   Pulse 71   Temp 98 F (36.7 C) (Oral)   Resp (!) 22   LMP 06/28/2016   SpO2 99%  No data found.   Physical Exam  Constitutional: She is oriented to person, place, and time. She appears well-developed and well-nourished. No distress.  HENT:  Head: Normocephalic.  Right Ear: External ear normal.  Left Ear: External ear normal.  Mouth/Throat: Oropharynx is clear and moist.  Eyes: EOM are normal. Pupils are equal, round, and reactive to light.  The lateral corner of the left eyelid with a 5 mm superficial epidermal abrasion. Approximately 2-3 mm in width. No bleeding.  Neck: Normal range of motion.  Neck supple.  Cardiovascular: Normal rate, regular rhythm and normal heart sounds.   Pulmonary/Chest: Effort normal and breath sounds normal.  Musculoskeletal: Normal range of motion. She exhibits no edema or deformity.  Neurological: She is alert and oriented to person, place, and time. She displays normal reflexes. No cranial nerve deficit or sensory deficit. She exhibits normal muscle tone. Coordination normal.  Skin: Skin is warm and dry. Capillary refill takes less than 2 seconds.  Psychiatric: She has a normal mood and affect. Her behavior is normal. Judgment and thought content normal.  Nursing note and vitals  reviewed.   Urgent Care Course     Procedures (including critical care time)  Labs Review Labs Reviewed - No data to display  Imaging Review No results found.   Visual Acuity Review  Right Eye Distance:   Left Eye Distance:   Bilateral Distance:    Right Eye Near:   Left Eye Near:    Bilateral Near:         MDM   1. Heat syncope, initial encounter   2. Abrasion of left eyelid, initial encounter   3. Dehydration   The abrasion to the outer quadrant of the upper left eyelid is superficial involving primarily the epidermis. Should heal well by secondary intention. The wound was cleaned and stay with soap and saline and Neosporin with Band-Aid was placed on the wound. Clean the abrasion to the left eye a little water as in the shower. This should heal up without further attention. Watch for any signs of infection such as swelling, redness or drainage of pus. He may use Neosporin ointment for the next 2 days but after that do not use it anymore. Read the instructions accompanying her papers. Be sure to drink plenty of fluids and stay well-hydrated. Patient states that she had a tetanus less than 5 years and does not want another one.    Hayden RasmussenMabe, Rashawnda Gaba, NP 07/12/16 2128

## 2016-07-12 NOTE — ED Triage Notes (Signed)
Pt reports she had a syncope episode today around 1000 while at the nail salon... Does not remember the fall   sts she has not been eating much and is probably dehydrated... Had a similar episode last year.   Has a small lac below left eyebrow  Did not call EMS  Denies abn behavior, bleeding, HA, weakness  A&O x4... NAD.Marland Kitchen. Ambulatory

## 2016-07-12 NOTE — Discharge Instructions (Signed)
Clean the abrasion to the left eye a little water as in the shower. This should heal up without further attention. Watch for any signs of infection such as swelling, redness or drainage of pus. He may use Neosporin ointment for the next 2 days but after that do not use it anymore. Read the instructions accompanying her papers. Be sure to drink plenty of fluids and stay well-hydrated.

## 2017-04-11 ENCOUNTER — Ambulatory Visit (HOSPITAL_COMMUNITY)
Admission: EM | Admit: 2017-04-11 | Discharge: 2017-04-11 | Disposition: A | Discharge status: Home / Self Care | Payer: Self-pay | Attending: Family Medicine | Admitting: Family Medicine

## 2017-04-11 ENCOUNTER — Encounter (HOSPITAL_COMMUNITY): Payer: Self-pay | Admitting: Family Medicine

## 2017-04-11 DIAGNOSIS — R590 Localized enlarged lymph nodes: Secondary | ICD-10-CM

## 2017-04-11 NOTE — ED Provider Notes (Signed)
  [facility]   [id number] 04/11/17 Arrival Time: 1154   SUBJECTIVE:  Donna Osborne is a 27 y.o. female who presents to the urgent care with complaint of bump to right ear that she noticed 3 days ago. Pain only with palpation. No facial skin irritation or eye irritation.  No ear pain.  Past Medical History:  Diagnosis Date  . Strep throat    History reviewed. No pertinent family history. Social History   Socioeconomic History  . Marital status: Single    Spouse name: Not on file  . Number of children: Not on file  . Years of education: Not on file  . Highest education level: Not on file  Social Needs  . Financial resource strain: Not on file  . Food insecurity - worry: Not on file  . Food insecurity - inability: Not on file  . Transportation needs - medical: Not on file  . Transportation needs - non-medical: Not on file  Occupational History  . Not on file  Tobacco Use  . Smoking status: Never Smoker  . Smokeless tobacco: Never Used  Substance and Sexual Activity  . Alcohol use: No  . Drug use: No  . Sexual activity: Yes    Birth control/protection: Condom  Other Topics Concern  . Not on file  Social History Narrative  . Not on file   No outpatient medications have been marked as taking for the 04/11/17 encounter [facility](Hospital Encounter).   No Known Allergies    ROS: As per HPI, remainder of ROS negative.   OBJECTIVE:   Vitals:   04/11/17 1221  BP: 113/82  Pulse: 75  Resp: 18  Temp: 98.4 F (36.9 C)  SpO2: 100%     General appearance: alert; no distress Eyes: PERRL; EOMI; conjunctiva normal HENT: normocephalic; atraumatic; 3 mm pretragal nonfixed lymph node which is nontender and there is no pore overlying it; oral mucosa normal Neck: supple Back: no CVA tenderness Extremities: no cyanosis or edema; symmetrical with no gross deformities Skin: warm and dry Neurologic: normal gait; grossly normal Psychological: alert and cooperative;  normal mood and affect      Labs:  Results for orders placed or performed during the hospital encounter of 06/30/16  Rapid strep screen  Result Value Ref Range   Streptococcus, Group A Screen (Direct) NEGATIVE NEGATIVE  Culture, group A strep  Result Value Ref Range   Specimen Description THROAT    Special Requests NONE Reflexed from [id number]    Culture NO GROUP A STREP (S.PYOGENES) ISOLATED    Report Status 07/02/2016 FINAL     Labs Reviewed - No data to display  No results found.     ASSESSMENT & PLAN:  1. Lymphadenopathy, preauricular     No evidence for problem that needs treatment.  Reviewed expectations re: course of current medical issues. Questions answered. Outlined signs and symptoms indicating need for more acute intervention. Patient verbalized understanding. After Visit Summary given.    Procedures:      Elvina SidleLauenstein, Syra Sirmons, MD 04/11/17 [phone]

## 2017-04-11 NOTE — ED Triage Notes (Signed)
Pt here for a bump to right ear that she noticed 3 days ago. Pain only with palpation.

## 2020-03-21 ENCOUNTER — Other Ambulatory Visit: Payer: Self-pay | Admitting: Internal Medicine

## 2020-03-22 LAB — COMPLETE METABOLIC PANEL WITH GFR
AG Ratio: 1.6 (calc) (ref 1.0–2.5)
ALT: 16 U/L (ref 6–29)
AST: 17 U/L (ref 10–30)
Albumin: 4.3 g/dL (ref 3.6–5.1)
Alkaline phosphatase (APISO): 49 U/L (ref 31–125)
BUN: 10 mg/dL (ref 7–25)
CO2: 24 mmol/L (ref 20–32)
Calcium: 9.1 mg/dL (ref 8.6–10.2)
Chloride: 106 mmol/L (ref 98–110)
Creat: 0.8 mg/dL (ref 0.50–1.10)
GFR, Est African American: 117 mL/min/{1.73_m2} (ref >=60)
GFR, Est Non African American: 101 mL/min/{1.73_m2} (ref >=60)
Globulin: 2.7 g/dL (calc) (ref 1.9–3.7)
Glucose, Bld: 84 mg/dL (ref 65–99)
Potassium: 4.1 mmol/L (ref 3.5–5.3)
Sodium: 139 mmol/L (ref 135–146)
Total Bilirubin: 0.5 mg/dL (ref 0.2–1.2)
Total Protein: 7 g/dL (ref 6.1–8.1)

## 2020-03-22 LAB — CBC
HCT: 39 % (ref 35.0–45.0)
Hemoglobin: 13.1 g/dL (ref 11.7–15.5)
MCH: 28.6 pg (ref 27.0–33.0)
MCHC: 33.6 g/dL (ref 32.0–36.0)
MCV: 85.2 fL (ref 80.0–100.0)
MPV: 10.2 fL (ref 7.5–12.5)
Platelets: 361 10*3/uL (ref 140–400)
RBC: 4.58 10*6/uL (ref 3.80–5.10)
RDW: 12.7 % (ref 11.0–15.0)
WBC: 8.2 10*3/uL (ref 3.8–10.8)

## 2020-03-22 LAB — LIPID PANEL
Cholesterol: 216 mg/dL — ABNORMAL HIGH (ref <200)
HDL: 41 mg/dL — ABNORMAL LOW (ref >=50)
LDL Cholesterol (Calc): 157 mg/dL (calc) — ABNORMAL HIGH
Non-HDL Cholesterol (Calc): 175 mg/dL (calc) — ABNORMAL HIGH (ref <130)
Total CHOL/HDL Ratio: 5.3 (calc) — ABNORMAL HIGH (ref <5.0)
Triglycerides: 79 mg/dL (ref <150)

## 2020-03-22 LAB — TSH: TSH: 3.2 mIU/L

## 2020-03-22 LAB — VITAMIN D 25 HYDROXY (VIT D DEFICIENCY, FRACTURES): Vit D, 25-Hydroxy: 10 ng/mL — ABNORMAL LOW (ref 30–100)

## 2020-09-22 ENCOUNTER — Other Ambulatory Visit: Payer: Self-pay

## 2020-09-22 ENCOUNTER — Emergency Department (HOSPITAL_COMMUNITY)
Admission: EM | Admit: 2020-09-22 | Discharge: 2020-09-22 | Disposition: A | Discharge status: Home / Self Care | Payer: No Typology Code available for payment source | Attending: Emergency Medicine | Admitting: Emergency Medicine

## 2020-09-22 ENCOUNTER — Encounter (HOSPITAL_COMMUNITY): Payer: Self-pay | Admitting: Emergency Medicine

## 2020-09-22 DIAGNOSIS — M25511 Pain in right shoulder: Secondary | ICD-10-CM | POA: Diagnosis not present

## 2020-09-22 DIAGNOSIS — R519 Headache, unspecified: Secondary | ICD-10-CM | POA: Diagnosis not present

## 2020-09-22 DIAGNOSIS — M25512 Pain in left shoulder: Secondary | ICD-10-CM | POA: Insufficient documentation

## 2020-09-22 DIAGNOSIS — Y92410 Unspecified street and highway as the place of occurrence of the external cause: Secondary | ICD-10-CM | POA: Diagnosis not present
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
## 2020-11-11 DIAGNOSIS — J302 Other seasonal allergic rhinitis: Secondary | ICD-10-CM | POA: Diagnosis not present

## 2020-11-11 DIAGNOSIS — Z309 Encounter for contraceptive management, unspecified: Secondary | ICD-10-CM | POA: Diagnosis not present

## 2021-01-24 ENCOUNTER — Other Ambulatory Visit: Payer: Self-pay

## 2021-01-24 ENCOUNTER — Encounter (HOSPITAL_BASED_OUTPATIENT_CLINIC_OR_DEPARTMENT_OTHER): Payer: Self-pay

## 2021-01-24 ENCOUNTER — Emergency Department (HOSPITAL_BASED_OUTPATIENT_CLINIC_OR_DEPARTMENT_OTHER)
Admission: EM | Admit: 2021-01-24 | Discharge: 2021-01-24 | Disposition: A | Discharge status: Home / Self Care | Payer: No Typology Code available for payment source | Attending: Emergency Medicine | Admitting: Emergency Medicine

## 2021-01-24 DIAGNOSIS — L02411 Cutaneous abscess of right axilla: Secondary | ICD-10-CM | POA: Diagnosis not present

## 2021-01-24 MED ORDER — LIDOCAINE-EPINEPHRINE (PF) 2 %-1:200000 IJ SOLN
10.0000 mL | Freq: Once | INTRAMUSCULAR | Status: AC
  Administered 2021-01-24: 10 mL
  Filled 2021-01-24: qty 20

## 2021-01-24 MED ORDER — DOXYCYCLINE HYCLATE 100 MG PO TABS
100.0000 mg | ORAL_TABLET | Freq: Once | ORAL | Status: AC
  Administered 2021-01-24: 22:00:00 100 mg via ORAL
  Filled 2021-01-24: qty 1

## 2021-01-24 MED ORDER — DOXYCYCLINE HYCLATE 100 MG PO CAPS: 100.0000 mg | ORAL_CAPSULE | Freq: Two times a day (BID) | ORAL | 0 refills | Status: DC

## 2021-01-24 NOTE — ED Notes (Signed)
ED provider at bedside for I&D procedure

## 2021-01-24 NOTE — ED Triage Notes (Signed)
Pt c/o right axilla abscess x 7 days-NAD-steady gait

## 2021-01-24 NOTE — Discharge Instructions (Addendum)
You were seen in the emergency department for a skin abscess- please see the attached handout for further information regarding this diagnoses. This area was incised and drained to help release the bacteria. We would like you to apply warm compresses and warm flushes to this area 4-5 times per day to help facilitate further draining as needed. We are also starting you on doxycycline, an antibiotic, in order to help treat the infection.   We have prescribed you new medication(s) today. Discuss the medications prescribed today with your pharmacist as they can have adverse effects and interactions with your other medicines including over the counter and prescribed medications. Seek medical evaluation if you start to experience new or abnormal symptoms after taking one of these medicines, seek care immediately if you start to experience difficulty breathing, feeling of your throat closing, facial swelling, or rash as these could be indications of a more serious allergic reaction  We would like you to have this area rechecked within 48 hours if it is not improving.  Packing needs to be removed in 48 hours as well.  This can be done by soaking the area in the shower and then pulling on the end of the ribbon string.  Please return to the ER , go to an urgent care, or see your primary care provider for this. Return to the ER sooner for new or worsening symptoms including, but not limited to increased pain, spreading redness, fevers, inability to keep fluids down, or any other concerns that you may have.

## 2021-01-24 NOTE — ED Notes (Signed)
E-SIG NOT WORKING IN TRIAGE-PT VERBALLY AGREED TO MSE STATEMENT

## 2021-01-24 NOTE — ED Provider Notes (Signed)
[facility] EMERGENCY DEPARTMENT Provider Note   CSN: [id number] Arrival date & time: 01/24/21  1758     History Chief Complaint  Patient presents with   Abscess    Donna Osborne is a 30 y.o. female.  Donna Osborne is a 30 y.o. female who is otherwise healthy, presents for evaluation of abscess to the right axilla.  She reports its been present for about a week.  She states it started small but is gotten progressively bigger throughout the week and now it is very painful to move her arm.  She has not seen any drainage from the area.  No fevers or chills, no nausea or vomiting.  No prior history of similar.  She does report that she shaves in this area but has not shaved recently.  No other aggravating or alleviating factors.  The history is provided by the patient.      Past Medical History:  Diagnosis Date   Strep throat     There are no problems to display for this patient.   Past Surgical History:  Procedure Laterality Date   WISDOM TOOTH EXTRACTION  2011     OB History     Gravida  1   Para      Term      Preterm      AB      Living         SAB      IAB      Ectopic      Multiple      Live Births              No family history on file.  Social History   Tobacco Use   Smoking status: Never   Smokeless tobacco: Never  Vaping Use   Vaping Use: Never used  Substance Use Topics   Alcohol use: Yes    Comment: occ   Drug use: No    Home Medications Prior to Admission medications   Medication Sig Start Date End Date Taking? Authorizing Provider  doxycycline (VIBRAMYCIN) 100 MG capsule Take 1 capsule (100 mg total) by mouth 2 (two) times daily. One po bid x 7 days 01/24/21  Yes Dartha Lodge, PA-C  ferrous sulfate 325 (65 FE) MG tablet Take 325 mg by mouth every other day.    [provider]    Allergies    Patient has no known allergies.  Review of Systems   Review of Systems  Constitutional:   Negative for chills and fever.  Gastrointestinal:  Negative for nausea and vomiting.  Skin:        Abscess  All other systems reviewed and are negative.  Physical Exam Updated Vital Signs BP 110/79 (BP Location: Left Arm)    Pulse 75    Temp 98.2 F (36.8 C) (Oral)    Resp 18    Ht 5\' 5"  (1.651 m)    Wt 80.7 kg    SpO2 95%    BMI 29.62 kg/m   Physical Exam Vitals and nursing note reviewed.  Constitutional:      General: She is not in acute distress.    Appearance: Normal appearance. She is well-developed and normal weight. She is not ill-appearing or diaphoretic.  HENT:     Head: Normocephalic and atraumatic.  Eyes:     General:        Right eye: No discharge.        Left eye: No discharge.  Pulmonary:     Effort: Pulmonary effort is normal. No respiratory distress.  Skin:    General: Skin is warm and dry.     Comments: 2x3 cm area of fluctuance in the right axilla with overlying erythema, no expressible drainage, 1 area of palpable lymphadenopathy surrounding the area.  Neurological:     Mental Status: She is alert and oriented to person, place, and time.     Coordination: Coordination normal.  Psychiatric:        Mood and Affect: Mood normal.        Behavior: Behavior normal.    ED Results / Procedures / Treatments   Labs (all labs ordered are listed, but only abnormal results are displayed) Labs Reviewed - No data to display  EKG None  Radiology No results found.  Procedures .Marland KitchenIncision and Drainage  Date/Time: 01/24/2021 10:03 PM Performed by: Dartha Lodge, PA-C Authorized by: Dartha Lodge, PA-C   Consent:    Consent obtained:  Verbal   Consent given by:  Patient   Risks discussed:  Bleeding, incomplete drainage, pain, infection and damage to other organs   Alternatives discussed:  No treatment Universal protocol:    Procedure explained and questions answered to patient or proxy's satisfaction: yes     Patient identity confirmed:  Verbally with  patient Location:    Type:  Abscess   Size:  2 x 3 cm   Location:  Upper extremity   Upper extremity location: Axilla. Pre-procedure details:    Skin preparation:  Antiseptic wash Anesthesia:    Anesthesia method:  Local infiltration   Local anesthetic:  Lidocaine 2% WITH epi Procedure type:    Complexity:  Simple Procedure details:    Incision types:  Single straight   Incision depth:  Dermal   Wound management:  Probed and deloculated and irrigated with saline   Drainage:  Purulent   Drainage amount:  Copious   Wound treatment:  Wound left open   Packing materials:  1/4 in iodoform gauze Post-procedure details:    Procedure completion:  Tolerated well, no immediate complications   Medications Ordered in ED Medications  lidocaine-EPINEPHrine (XYLOCAINE W/EPI) 2 %-1:200000 (PF) injection 10 mL (10 mLs Infiltration Given by Other 01/24/21 2033)  doxycycline (VIBRA-TABS) tablet 100 mg (100 mg Oral Given 01/24/21 2156)    ED Course  I have reviewed the triage vital signs and the nursing notes.  Pertinent labs & imaging results that were available during my care of the patient were reviewed by me and considered in my medical decision making (see chart for details).    MDM Rules/Calculators/A&P                         Patient presents to the ED with abscess amenable to I&D based on exam. Procedure per note above.  Abscess location small amount of iodoform gauze was used for packing. Given mild surrounding cellulitis will start patient on Doxycycline. Recommended application of warm compresses/soaks/flushing. Will have patient return for wound recheck in 2 days. I discussed treatment plan, need for follow-up, and return precautions with the patient. Provided opportunity for questions, patient confirmed understanding and is in agreement with plan.     Final Clinical Impression(s) / ED Diagnoses Final diagnoses:  Abscess of axilla, right    Rx / DC Orders ED Discharge Orders           Ordered    doxycycline (VIBRAMYCIN) 100 MG capsule  2  times daily        01/24/21 2152             Legrand Rams 01/24/21 2205    Sloan Leiter, DO 01/25/21 [phone]

## 2021-02-14 DIAGNOSIS — Z309 Encounter for contraceptive management, unspecified: Secondary | ICD-10-CM | POA: Diagnosis not present

## 2021-06-23 DIAGNOSIS — J302 Other seasonal allergic rhinitis: Secondary | ICD-10-CM | POA: Diagnosis not present

## 2021-06-23 DIAGNOSIS — B355 Tinea imbricata: Secondary | ICD-10-CM | POA: Diagnosis not present

## 2021-06-23 DIAGNOSIS — Z309 Encounter for contraceptive management, unspecified: Secondary | ICD-10-CM | POA: Diagnosis not present

## 2021-07-24 DIAGNOSIS — J302 Other seasonal allergic rhinitis: Secondary | ICD-10-CM | POA: Diagnosis not present

## 2021-07-24 DIAGNOSIS — Z309 Encounter for contraceptive management, unspecified: Secondary | ICD-10-CM | POA: Diagnosis not present

## 2021-09-25 DIAGNOSIS — Z309 Encounter for contraceptive management, unspecified: Secondary | ICD-10-CM | POA: Diagnosis not present

## 2021-12-25 DIAGNOSIS — J302 Other seasonal allergic rhinitis: Secondary | ICD-10-CM | POA: Diagnosis not present

## 2021-12-25 DIAGNOSIS — Z309 Encounter for contraceptive management, unspecified: Secondary | ICD-10-CM | POA: Diagnosis not present

## 2022-03-22 DIAGNOSIS — F99 Mental disorder, not otherwise specified: Secondary | ICD-10-CM | POA: Diagnosis not present

## 2022-04-02 ENCOUNTER — Other Ambulatory Visit: Payer: Self-pay | Admitting: Internal Medicine

## 2022-04-03 LAB — CBC
HCT: 39.2 % (ref 35.0–45.0)
Hemoglobin: 13.2 g/dL (ref 11.7–15.5)
MCH: 28.3 pg (ref 27.0–33.0)
MCHC: 33.7 g/dL (ref 32.0–36.0)
MCV: 84.1 fL (ref 80.0–100.0)
MPV: 9.8 fL (ref 7.5–12.5)
Platelets: 376 10*3/uL (ref 140–400)
RBC: 4.66 10*6/uL (ref 3.80–5.10)
RDW: 12.2 % (ref 11.0–15.0)
WBC: 9.2 10*3/uL (ref 3.8–10.8)

## 2022-04-03 LAB — COMPLETE METABOLIC PANEL WITH GFR
AG Ratio: 1.4 (calc) (ref 1.0–2.5)
ALT: 11 U/L (ref 6–29)
AST: 16 U/L (ref 10–30)
Albumin: 4.3 g/dL (ref 3.6–5.1)
Alkaline phosphatase (APISO): 58 U/L (ref 31–125)
BUN: 7 mg/dL (ref 7–25)
CO2: 23 mmol/L (ref 20–32)
Calcium: 9.3 mg/dL (ref 8.6–10.2)
Chloride: 108 mmol/L (ref 98–110)
Creat: 0.77 mg/dL (ref 0.50–0.96)
Globulin: 3 g/dL (calc) (ref 1.9–3.7)
Glucose, Bld: 101 mg/dL — ABNORMAL HIGH (ref 65–99)
Potassium: 4 mmol/L (ref 3.5–5.3)
Sodium: 141 mmol/L (ref 135–146)
Total Bilirubin: 0.4 mg/dL (ref 0.2–1.2)
Total Protein: 7.3 g/dL (ref 6.1–8.1)
eGFR: 107 mL/min/{1.73_m2} (ref >=60)

## 2022-04-03 LAB — LIPID PANEL
Cholesterol: 202 mg/dL — ABNORMAL HIGH (ref <200)
HDL: 44 mg/dL — ABNORMAL LOW (ref >=50)
LDL Cholesterol (Calc): 135 mg/dL (calc) — ABNORMAL HIGH
Non-HDL Cholesterol (Calc): 158 mg/dL (calc) — ABNORMAL HIGH (ref <130)
Total CHOL/HDL Ratio: 4.6 (calc) (ref <5.0)
Triglycerides: 122 mg/dL (ref <150)

## 2022-04-03 LAB — VITAMIN D 25 HYDROXY (VIT D DEFICIENCY, FRACTURES): Vit D, 25-Hydroxy: 28 ng/mL — ABNORMAL LOW (ref 30–100)

## 2022-04-05 DIAGNOSIS — F99 Mental disorder, not otherwise specified: Secondary | ICD-10-CM | POA: Diagnosis not present

## 2022-04-26 DIAGNOSIS — J302 Other seasonal allergic rhinitis: Secondary | ICD-10-CM | POA: Diagnosis not present

## 2022-04-26 DIAGNOSIS — J3489 Other specified disorders of nose and nasal sinuses: Secondary | ICD-10-CM | POA: Diagnosis not present

## 2022-05-09 DIAGNOSIS — F99 Mental disorder, not otherwise specified: Secondary | ICD-10-CM | POA: Diagnosis not present

## 2022-05-10 DIAGNOSIS — J302 Other seasonal allergic rhinitis: Secondary | ICD-10-CM | POA: Diagnosis not present

## 2022-05-10 DIAGNOSIS — J349 Unspecified disorder of nose and nasal sinuses: Secondary | ICD-10-CM | POA: Diagnosis not present

## 2022-05-29 ENCOUNTER — Encounter: Payer: Self-pay | Admitting: Obstetrics and Gynecology

## 2022-05-29 ENCOUNTER — Other Ambulatory Visit (HOSPITAL_COMMUNITY)
Admission: RE | Admit: 2022-05-29 | Discharge: 2022-05-29 | Disposition: A | Discharge status: Home / Self Care | Payer: 59 | Source: Ambulatory Visit | Attending: Advanced Practice Midwife | Admitting: Advanced Practice Midwife

## 2022-05-29 ENCOUNTER — Ambulatory Visit: Payer: 59 | Admitting: Obstetrics and Gynecology

## 2022-05-29 VITALS — BP 105/70 | HR 88 | Ht 65.0 in | Wt 182.0 lb

## 2022-05-29 DIAGNOSIS — Z1339 Encounter for screening examination for other mental health and behavioral disorders: Secondary | ICD-10-CM | POA: Diagnosis not present

## 2022-05-29 DIAGNOSIS — Z01419 Encounter for gynecological examination (general) (routine) without abnormal findings: Secondary | ICD-10-CM | POA: Insufficient documentation

## 2022-05-29 NOTE — Progress Notes (Signed)
32 y.o. New GYN presents for AEX/PAP/STD screening.

## 2022-05-29 NOTE — Progress Notes (Signed)
ANNUAL EXAM Patient name: Donna Osborne MRN [id number]  Date of birth: 09-Jun-1990 Chief Complaint:   New Patient (Initial Visit)  History of Present Illness:   Donna Osborne is a 32 y.o. G1P0 with No LMP recorded. Patient has had an injection. being seen today for a routine annual exam.  Current complaints: none   Upstream - 05/29/22 1326       Pregnancy Intention Screening   Does the patient want to become pregnant in the next year? No    Does the patient's partner want to become pregnant in the next year? No    Would the patient like to discuss contraceptive options today? No      Contraception Wrap Up   Current Method Hormonal Injection    End Method Hormonal Injection            The pregnancy intention screening data noted above was reviewed. Potential methods of contraception were discussed. The patient elected to proceed with Hormonal Injection.   Last pap unsure. H/O abnormal pap: no Last mammogram: n/a Family h/o breast cancer: no Last colonoscopy: n/a Family h/o colorectal cancer: no HPV vaccine: unsure     05/29/2022    1:25 PM  Depression screen PHQ 2/9  Decreased Interest 0  Down, Depressed, Hopeless 0  PHQ - 2 Score 0  Altered sleeping 0  Tired, decreased energy 0  Change in appetite 0  Feeling bad or failure about yourself  0  Trouble concentrating 0  Moving slowly or fidgety/restless 0  Suicidal thoughts 0  PHQ-9 Score 0  Difficult doing work/chores Not difficult at all         No data to display          Review of Systems:   Pertinent items are noted in HPI Denies any headaches, blurred vision, fatigue, shortness of breath, chest pain, abdominal pain, abnormal vaginal discharge/itching/odor/irritation, problems with periods, bowel movements, urination, or intercourse unless otherwise stated above. Pertinent History Reviewed:  Reviewed past medical,surgical, social and family history.  Reviewed problem list, medications and  allergies. Physical Assessment:   Vitals:   05/29/22 1318  BP: 105/70  Pulse: 88  Weight: 182 lb (82.6 kg)  Height:  (1.651 m)  Body mass index is 30.29 kg/m.        Physical Examination:   General appearance - well appearing, and in no distress  Mental status - alert, oriented to person, place, and time  Chest - respiratory effort normal  Heart - normal peripheral perfusion  Breasts - breasts appear normal, no suspicious masses, no skin or nipple changes or axillary nodes  Abdomen - soft, nontender, nondistended, no masses or organomegaly  Pelvic - VULVA: normal appearing vulva with no masses, tenderness or lesions  VAGINA: normal appearing vagina with normal color and discharge, no lesions  CERVIX: normal appearing cervix without discharge or lesions  Thin prep pap is done with reflex HR HPV cotesting Chaperone present for exam  No results found for this or any previous visit (from the past 24 hour(s)).  Assessment & Plan:  1) Well-Woman Exam Mammogram: @ 30yo, or sooner if problems Colonoscopy: @ 30yo, or sooner if problems Pap: collected Gardasil: unsure if she has received, she will f/u with her PCP GC/CT: collected HIV/HCV: ordered  Labs/procedures today:   Orders Placed This Encounter  Procedures   HIV antibody (with reflex)   Hepatitis C Antibody   Hepatitis B Surface AntiGEN   RPR   Meds:  No orders of the defined types were placed in this encounter.  Follow-up: Return in about 1 year (around 05/29/2023) for annual exam.  Lennart Pall, MD 05/29/2022 1:49 PM

## 2022-05-30 LAB — CERVICOVAGINAL ANCILLARY ONLY
Chlamydia: NEGATIVE
Comment: NEGATIVE
Comment: NEGATIVE
Comment: NORMAL
Neisseria Gonorrhea: NEGATIVE
Trichomonas: NEGATIVE

## 2022-05-30 LAB — HEPATITIS C ANTIBODY: Hep C Virus Ab: NONREACTIVE

## 2022-05-30 LAB — HIV ANTIBODY (ROUTINE TESTING W REFLEX): HIV Screen 4th Generation wRfx: NONREACTIVE

## 2022-05-30 LAB — RPR: RPR Ser Ql: NONREACTIVE

## 2022-05-30 LAB — HEPATITIS B SURFACE ANTIGEN: Hepatitis B Surface Ag: NEGATIVE

## 2022-06-06 DIAGNOSIS — F99 Mental disorder, not otherwise specified: Secondary | ICD-10-CM | POA: Diagnosis not present

## 2022-06-13 LAB — CYTOLOGY - PAP: Diagnosis: NEGATIVE

## 2022-06-18 DIAGNOSIS — J343 Hypertrophy of nasal turbinates: Secondary | ICD-10-CM | POA: Diagnosis not present

## 2022-06-18 DIAGNOSIS — J342 Deviated nasal septum: Secondary | ICD-10-CM | POA: Diagnosis not present

## 2022-06-18 DIAGNOSIS — R0981 Nasal congestion: Secondary | ICD-10-CM | POA: Diagnosis not present

## 2022-06-18 DIAGNOSIS — J310 Chronic rhinitis: Secondary | ICD-10-CM | POA: Diagnosis not present

## 2022-06-19 ENCOUNTER — Encounter: Payer: Self-pay | Admitting: Physician Assistant

## 2022-07-03 DIAGNOSIS — R0981 Nasal congestion: Secondary | ICD-10-CM | POA: Diagnosis not present

## 2022-07-03 DIAGNOSIS — J310 Chronic rhinitis: Secondary | ICD-10-CM | POA: Diagnosis not present

## 2022-07-03 DIAGNOSIS — J343 Hypertrophy of nasal turbinates: Secondary | ICD-10-CM | POA: Diagnosis not present

## 2022-07-11 DIAGNOSIS — F99 Mental disorder, not otherwise specified: Secondary | ICD-10-CM | POA: Diagnosis not present

## 2022-07-12 DIAGNOSIS — J302 Other seasonal allergic rhinitis: Secondary | ICD-10-CM | POA: Diagnosis not present

## 2022-07-12 DIAGNOSIS — Z309 Encounter for contraceptive management, unspecified: Secondary | ICD-10-CM | POA: Diagnosis not present

## 2022-08-02 DIAGNOSIS — F99 Mental disorder, not otherwise specified: Secondary | ICD-10-CM | POA: Diagnosis not present

## 2022-08-17 DIAGNOSIS — F99 Mental disorder, not otherwise specified: Secondary | ICD-10-CM | POA: Diagnosis not present

## 2022-09-27 DIAGNOSIS — F99 Mental disorder, not otherwise specified: Secondary | ICD-10-CM | POA: Diagnosis not present

## 2022-09-28 DIAGNOSIS — Z309 Encounter for contraceptive management, unspecified: Secondary | ICD-10-CM | POA: Diagnosis not present

## 2022-10-25 DIAGNOSIS — F99 Mental disorder, not otherwise specified: Secondary | ICD-10-CM | POA: Diagnosis not present

## 2022-11-22 DIAGNOSIS — F99 Mental disorder, not otherwise specified: Secondary | ICD-10-CM | POA: Diagnosis not present

## 2022-12-17 DIAGNOSIS — Z309 Encounter for contraceptive management, unspecified: Secondary | ICD-10-CM | POA: Diagnosis not present

## 2023-01-10 DIAGNOSIS — F99 Mental disorder, not otherwise specified: Secondary | ICD-10-CM | POA: Diagnosis not present

## 2023-03-07 DIAGNOSIS — F99 Mental disorder, not otherwise specified: Secondary | ICD-10-CM | POA: Diagnosis not present

## 2023-04-04 DIAGNOSIS — F99 Mental disorder, not otherwise specified: Secondary | ICD-10-CM | POA: Diagnosis not present

## 2023-04-25 DIAGNOSIS — F99 Mental disorder, not otherwise specified: Secondary | ICD-10-CM | POA: Diagnosis not present

## 2023-06-20 DIAGNOSIS — F99 Mental disorder, not otherwise specified: Secondary | ICD-10-CM | POA: Diagnosis not present

## 2023-06-28 DIAGNOSIS — Z131 Encounter for screening for diabetes mellitus: Secondary | ICD-10-CM | POA: Diagnosis not present

## 2023-06-28 DIAGNOSIS — J302 Other seasonal allergic rhinitis: Secondary | ICD-10-CM | POA: Diagnosis not present

## 2023-06-28 DIAGNOSIS — Z309 Encounter for contraceptive management, unspecified: Secondary | ICD-10-CM | POA: Diagnosis not present

## 2023-06-28 DIAGNOSIS — Z124 Encounter for screening for malignant neoplasm of cervix: Secondary | ICD-10-CM | POA: Diagnosis not present

## 2023-06-28 DIAGNOSIS — Z13220 Encounter for screening for lipoid disorders: Secondary | ICD-10-CM | POA: Diagnosis not present

## 2023-06-28 DIAGNOSIS — Z0000 Encounter for general adult medical examination without abnormal findings: Secondary | ICD-10-CM | POA: Diagnosis not present

## 2023-08-12 ENCOUNTER — Ambulatory Visit: Admitting: Obstetrics and Gynecology

## 2023-08-15 DIAGNOSIS — F99 Mental disorder, not otherwise specified: Secondary | ICD-10-CM | POA: Diagnosis not present

## 2023-08-29 DIAGNOSIS — F99 Mental disorder, not otherwise specified: Secondary | ICD-10-CM | POA: Diagnosis not present

## 2023-09-30 DIAGNOSIS — L259 Unspecified contact dermatitis, unspecified cause: Secondary | ICD-10-CM | POA: Diagnosis not present

## 2023-09-30 DIAGNOSIS — Z309 Encounter for contraceptive management, unspecified: Secondary | ICD-10-CM | POA: Diagnosis not present

## 2023-10-10 DIAGNOSIS — F99 Mental disorder, not otherwise specified: Secondary | ICD-10-CM | POA: Diagnosis not present

## 2023-12-11 DIAGNOSIS — J302 Other seasonal allergic rhinitis: Secondary | ICD-10-CM | POA: Diagnosis not present

## 2023-12-11 DIAGNOSIS — Z309 Encounter for contraceptive management, unspecified: Secondary | ICD-10-CM | POA: Diagnosis not present

## 2023-12-22 ENCOUNTER — Emergency Department (HOSPITAL_COMMUNITY)
Admission: EM | Admit: 2023-12-22 | Discharge: 2023-12-22 | Disposition: A | Discharge status: Home / Self Care | Attending: Emergency Medicine | Admitting: Emergency Medicine

## 2023-12-22 ENCOUNTER — Other Ambulatory Visit: Payer: Self-pay

## 2023-12-22 ENCOUNTER — Encounter (HOSPITAL_COMMUNITY): Payer: Self-pay

## 2023-12-22 DIAGNOSIS — J069 Acute upper respiratory infection, unspecified: Secondary | ICD-10-CM | POA: Insufficient documentation

## 2023-12-22 DIAGNOSIS — B9789 Other viral agents as the cause of diseases classified elsewhere: Secondary | ICD-10-CM | POA: Diagnosis not present

## 2023-12-22 DIAGNOSIS — R509 Fever, unspecified: Secondary | ICD-10-CM | POA: Diagnosis present

## 2023-12-22 LAB — RESP PANEL BY RT-PCR (RSV, FLU A&B, COVID)  RVPGX2
Influenza A by PCR: NEGATIVE
Influenza B by PCR: NEGATIVE
Resp Syncytial Virus by PCR: NEGATIVE
SARS Coronavirus 2 by RT PCR: NEGATIVE

## 2023-12-22 LAB — GROUP A STREP BY PCR: Group A Strep by PCR: NOT DETECTED

## 2023-12-22 NOTE — ED Provider Notes (Signed)
 [facility] Provider Note   CSN: [id number] Arrival date & time: 12/22/23  9195     Patient presents with: Sore Throat and Chills   Donna Osborne is a 33 y.o. female presents with complaints of sore throat, subjective fevers and chills since Thursday.  Denies any cough, nasal congestion, abdominal pain or vomiting.  Denies any difficulty swallowing or breathing.  Does describe some discomfort with swallowing however.  Reports recent sick contact with her daughter.    Sore Throat   Past Medical History:  Diagnosis Date   Strep throat    Past Surgical History:  Procedure Laterality Date   WISDOM TOOTH EXTRACTION  2011        Prior to Admission medications   Medication Sig Start Date End Date Taking? Authorizing Provider  fluticasone  (FLONASE ) 50 MCG/ACT nasal spray Place 1 spray into both nostrils as needed for allergies. 05/28/22  Yes [provider]  medroxyPROGESTERone (DEPO-PROVERA) 150 MG/ML injection Inject 150 mg into the muscle every 3 (three) months.   Yes [provider]    Allergies: Patient has no known allergies.    Review of Systems  Updated Vital Signs BP 117/75 (BP Location: Right Arm)   Pulse 72   Temp 98.3 F (36.8 C)   Resp 18   Ht 5' 5 (1.651 m)   Wt 74.4 kg   LMP 12/08/2023 (Approximate)   SpO2 98%   BMI 27.29 kg/m   Physical Exam Vitals and nursing note reviewed.  Constitutional:      General: She is not in acute distress.    Appearance: She is well-developed.  HENT:     Head: Normocephalic and atraumatic.     Mouth/Throat:     Comments: No cervical lymphadenopathy or tonsillar exudate, uvula is midline, there is no facial or submandibular swelling, no sublingual elevation, no trismus or pooling of secretions or abnormal phonation Eyes:     Conjunctiva/sclera: Conjunctivae normal.  Cardiovascular:     Rate and Rhythm: Normal rate and regular rhythm.     Heart  sounds: No murmur heard. Pulmonary:     Effort: Pulmonary effort is normal. No respiratory distress.     Breath sounds: Normal breath sounds.  Abdominal:     Palpations: Abdomen is soft.     Tenderness: There is no abdominal tenderness.  Musculoskeletal:        General: No swelling.     Cervical back: Neck supple.  Skin:    General: Skin is warm and dry.     Capillary Refill: Capillary refill takes less than 2 seconds.  Neurological:     Mental Status: She is alert.  Psychiatric:        Mood and Affect: Mood normal.     (all labs ordered are listed, but only abnormal results are displayed) Labs Reviewed  RESP PANEL BY RT-PCR (RSV, FLU A&B, COVID)  [id number]  GROUP A STREP BY PCR    EKG: None  Radiology: No results found.   Procedures   Medications Ordered in the ED - No data to display  Clinical Course as of 12/22/23 0936  Sun Dec 22, 2023  9168 Otherwise healthy patient evaluated for complaints of sore throat with subjective fevers and chills since Thursday.  Is not associated with any other URI symptoms.  Upon arrival she is hemodynamically stable.  Her exam is entirely benign.  Will obtain routine swabs for further evaluation. [JT]  [id number] Group A Strep  by PCR if patient complains of sore throat. Negative [JT]  0936 Resp panel by RT-PCR (RSV, Flu A&B, Covid) Anterior Nasal Swab Negative [JT]  0936 Workup overall reassuring.  Likely etiology viral URI.  Will be discharged home recommended supportive care and PCP follow-up. [JT]    Clinical Course User Index [JT] Donnajean Lynwood DEL, PA-C                                 Medical Decision Making  This patient presents to the ED with chief complaint(s) of Sore throat.  The complaint involves an extensive differential diagnosis and also carries with it a high risk of complications and morbidity.   Pertinent past medical history as listed in HPI  The differential diagnosis includes  Based off exam and history do not  suspect Ludwig's angina or deep space infection, or pneumonia. Additional history obtained: Records reviewed Care Everywhere/External Records  Disposition:   Patient will be discharged home. The patient has been appropriately medically screened and/or stabilized in the ED. I have low suspicion for any other emergent medical condition which would require further screening, evaluation or treatment in the ED or require inpatient management. At time of discharge the patient is hemodynamically stable and in no acute distress. I have discussed work-up results and diagnosis with patient and answered all questions. Patient is agreeable with discharge plan. We discussed strict return precautions for returning to the emergency department and they verbalized understanding.     Social Determinants of Health:   Patient's impaired access to primary care  increases the complexity of managing their presentation  This note was dictated with voice recognition software.  Despite best efforts at proofreading, errors may have occurred which can change the documentation meaning.       Final diagnoses:  Viral upper respiratory tract infection    ED Discharge Orders     None          Donnajean Lynwood DEL, PA-C 12/22/23 9063    Dean Clarity, MD 12/22/23 [phone]

## 2023-12-22 NOTE — Discharge Instructions (Signed)
 You were evaluated in the emergency room for sore throat. You may use Tylenol 1000 mg and/or Motrin 600 mg every 4-6 hours up to 3 times a day for fever or body aches.  Please keep in mind that this dosing is not meant to be continued long-term and that many over-the-counter cough and flu medications contain acetaminophen or ibuprofen. You can expect your current symptoms to linger over the next week or two but please return to the emergency room if you experience any new or worsening symptoms including persistent fevers, worsening productive cough and persistent vomiting. Please follow-up with your primary care provider regarding your ER visit.

## 2023-12-22 NOTE — ED Triage Notes (Signed)
 Pt c.o sore throat and chills since Thursday. Pt using cough drops. Airway clear, no swelling noted
